# Patient Record
Sex: Female | Born: 1970 | Race: White | Hispanic: No | Marital: Single | Smoking: Never smoker
Health system: Southern US, Community
[De-identification: ages and names within clinical notes are randomized; demographics above are authoritative.]

## PROBLEM LIST (undated history)

## (undated) DIAGNOSIS — L309 Dermatitis, unspecified: Secondary | ICD-10-CM

## (undated) DIAGNOSIS — I872 Venous insufficiency (chronic) (peripheral): Secondary | ICD-10-CM

## (undated) DIAGNOSIS — D649 Anemia, unspecified: Secondary | ICD-10-CM

---

## 2018-11-02 ENCOUNTER — Emergency Department
Admission: EM | Admit: 2018-11-02 | Discharge: 2018-11-02 | Disposition: A | Discharge status: Home / Self Care | Payer: Self-pay | Attending: Emergency Medicine | Admitting: Emergency Medicine

## 2018-11-02 ENCOUNTER — Other Ambulatory Visit: Payer: Self-pay

## 2018-11-02 DIAGNOSIS — N760 Acute vaginitis: Secondary | ICD-10-CM | POA: Insufficient documentation

## 2018-11-02 DIAGNOSIS — R519 Headache, unspecified: Secondary | ICD-10-CM

## 2018-11-02 DIAGNOSIS — E876 Hypokalemia: Secondary | ICD-10-CM | POA: Insufficient documentation

## 2018-11-02 DIAGNOSIS — N898 Other specified noninflammatory disorders of vagina: Secondary | ICD-10-CM | POA: Insufficient documentation

## 2018-11-02 DIAGNOSIS — E860 Dehydration: Secondary | ICD-10-CM | POA: Insufficient documentation

## 2018-11-02 DIAGNOSIS — R42 Dizziness and giddiness: Secondary | ICD-10-CM

## 2018-11-02 DIAGNOSIS — B9689 Other specified bacterial agents as the cause of diseases classified elsewhere: Secondary | ICD-10-CM | POA: Insufficient documentation

## 2018-11-02 DIAGNOSIS — R110 Nausea: Secondary | ICD-10-CM | POA: Insufficient documentation

## 2018-11-02 DIAGNOSIS — R51 Headache: Secondary | ICD-10-CM | POA: Insufficient documentation

## 2018-11-02 HISTORY — DX: Venous insufficiency (chronic) (peripheral): I87.2

## 2018-11-02 HISTORY — DX: Dermatitis, unspecified: L30.9

## 2018-11-02 HISTORY — DX: Anemia, unspecified: D64.9

## 2018-11-02 LAB — CHLAMYDIA/NGC RT PCR (ARMC ONLY)
Chlamydia Tr: NOT DETECTED
N GONORRHOEAE: NOT DETECTED

## 2018-11-02 LAB — CBC
HCT: 35.8 % — ABNORMAL LOW (ref 36.0–46.0)
Hemoglobin: 11.6 g/dL — ABNORMAL LOW (ref 12.0–15.0)
MCH: 28.4 pg (ref 26.0–34.0)
MCHC: 32.4 g/dL (ref 30.0–36.0)
MCV: 87.5 fL (ref 80.0–100.0)
NRBC: 0 % (ref 0.0–0.2)
Platelets: 288 10*3/uL (ref 150–400)
RBC: 4.09 MIL/uL (ref 3.87–5.11)
RDW: 13.7 % (ref 11.5–15.5)
WBC: 9.7 10*3/uL (ref 4.0–10.5)

## 2018-11-02 LAB — URINALYSIS, COMPLETE (UACMP) WITH MICROSCOPIC
Bilirubin Urine: NEGATIVE
Glucose, UA: NEGATIVE mg/dL
Hgb urine dipstick: NEGATIVE
Ketones, ur: NEGATIVE mg/dL
NITRITE: NEGATIVE
Protein, ur: 30 mg/dL — AB
SPECIFIC GRAVITY, URINE: 1.025 (ref 1.005–1.030)
pH: 7 (ref 5.0–8.0)

## 2018-11-02 LAB — BASIC METABOLIC PANEL
Anion gap: 6 (ref 5–15)
BUN: 12 mg/dL (ref 6–20)
CHLORIDE: 105 mmol/L (ref 98–111)
CO2: 28 mmol/L (ref 22–32)
Calcium: 9.4 mg/dL (ref 8.9–10.3)
Creatinine, Ser: 0.54 mg/dL (ref 0.44–1.00)
GFR calc non Af Amer: >60 mL/min (ref >60)
Glucose, Bld: 99 mg/dL (ref 70–99)
POTASSIUM: 3.2 mmol/L — AB (ref 3.5–5.1)
Sodium: 139 mmol/L (ref 135–145)

## 2018-11-02 LAB — WET PREP, GENITAL
SPERM: NONE SEEN
Trich, Wet Prep: NONE SEEN
YEAST WET PREP: NONE SEEN

## 2018-11-02 LAB — PREGNANCY, URINE: Preg Test, Ur: NEGATIVE

## 2018-11-02 MED ORDER — BUTALBITAL-APAP-CAFFEINE 50-325-40 MG PO TABS: 1.0000 | ORAL_TABLET | Freq: Four times a day (QID) | ORAL | 0 refills | Status: AC | PRN

## 2018-11-02 MED ORDER — POTASSIUM CHLORIDE CRYS ER 20 MEQ PO TBCR
40.0000 meq | EXTENDED_RELEASE_TABLET | Freq: Once | ORAL | Status: AC
  Administered 2018-11-02: 40 meq via ORAL
  Filled 2018-11-02: qty 2

## 2018-11-02 MED ORDER — METRONIDAZOLE 500 MG PO TABS: 500.0000 mg | ORAL_TABLET | Freq: Two times a day (BID) | ORAL | 0 refills | Status: AC

## 2018-11-02 MED ORDER — SODIUM CHLORIDE 0.9 % IV BOLUS: 1000.0000 mL | Freq: Once | INTRAVENOUS | Status: DC

## 2018-11-02 MED ORDER — KETOROLAC TROMETHAMINE 30 MG/ML IJ SOLN
15.0000 mg | Freq: Once | INTRAMUSCULAR | Status: AC
  Administered 2018-11-02: 15 mg via INTRAVENOUS
  Filled 2018-11-02: qty 1

## 2018-11-02 MED ORDER — METOCLOPRAMIDE HCL 5 MG/ML IJ SOLN
10.0000 mg | Freq: Once | INTRAMUSCULAR | Status: AC
  Administered 2018-11-02: 10 mg via INTRAVENOUS
  Filled 2018-11-02: qty 2

## 2018-11-02 MED ORDER — METRONIDAZOLE 500 MG PO TABS
500.0000 mg | ORAL_TABLET | Freq: Once | ORAL | Status: AC
  Administered 2018-11-02: 500 mg via ORAL
  Filled 2018-11-02: qty 1

## 2018-11-02 NOTE — ED Triage Notes (Signed)
C/o dizziness upon awakening, body aches. Pt reports she recently moved here and does not have PCP established. Pt does report hx of anemia. Nose bleed X 2 days ago-unsure if related.  Pt alert and oriented X4, active, cooperative, pt in NAD. RR even and unlabored, color WNL.

## 2018-11-02 NOTE — ED Provider Notes (Signed)
[facility] Emergency Department Provider Note  ____________________________________________  Time seen: Approximately 6:08 PM  I have reviewed the triage vital signs and the nursing notes.   HISTORY  Chief Complaint Dizziness   HPI Cindy Myers is a 47 y.o. female with a history of anemia presents for evaluation of dizziness.  Patient [place] ago [place] and has not had a chance to establish care with the primary care doctor.  She has a history of iron deficiency anemia on iron supplementation.  She reports over the last few days she has had intermittent episodes of feeling lightheaded like she is going to pass out.  These episodes usually happen with standing up.  She had an episode 2 days ago associated with a nosebleed however that resolved.  The one today has been more persistent.  She denies any vertigo, imbalance, gait abnormalities, slurred speech, facial droop, unilateral weakness or numbness.  She describes her symptoms as feeling like she is going to pass out when she stands up.  She also has had more frequent headaches.  She does have a history of migraine headaches.  This headache feels like her chronic headaches.  Over the last week she has had a more often.  The headache is frontal, pressure, currently 6 out of 10 which is the highest intensity has been.  She has tried Tylenol and ibuprofen at home.  She denies fever or chills, abdominal pain.  She has had nausea but no vomiting.  She has had normal appetite.  She reports drinking very small amount of alcohol in a [place] last night [place] [employer].  She reports that she drinks very small amount of alcohol usually once or twice a year.  She denies thunderclap headache, fever or chills, URI symptoms, sore throat, chest pain or shortness of breath.  She is also complaining of 2 weeks of foul smelling vaginal discharge, no prior of STDs. No dysuria. Patient presents  to the emergency room today because she was told by her doctor that if she has worsening headaches and dizzy spells that her anemia could be worse and since she does not have a PCP to check her lab work she came to the emergency room for that.   Past Medical History:  Diagnosis Date  . Anemia   . Eczema   . Venous insufficiency     There are no active problems to display for this patient.   History reviewed. No pertinent surgical history.  Prior to Admission medications   Medication Sig Start Date End Date Taking? Authorizing Provider  butalbital-acetaminophen-caffeine (FIORICET, ESGIC) 50-325-40 MG tablet Take 1-2 tablets by mouth every 6 (six) hours as needed for headache. 11/02/18 11/02/19  Nita SickleVeronese, [place], MD  metroNIDAZOLE (FLAGYL) 500 MG tablet Take 1 tablet (500 mg total) by mouth 2 (two) times daily for 7 days. 11/02/18 11/09/18  Nita SickleVeronese, [place], MD    Allergies Patient has no known allergies.  No family history on file.  Social History Social History   Tobacco Use  . Smoking status: Never Smoker  Substance Use Topics  . Alcohol use: Not Currently  . Drug use: Not on file    Review of Systems  Constitutional: Negative for fever. + Lightheadedness Eyes: Negative for visual changes. ENT: Negative for sore throat. Neck: No neck pain  Cardiovascular: Negative for chest pain. Respiratory: Negative for shortness of breath. Gastrointestinal: Negative for abdominal pain, vomiting or diarrhea. Genitourinary: Negative for dysuria. Musculoskeletal: Negative for  back pain. Skin: Negative for rash. Neurological: Negative for weakness or numbness. + HA Psych: No SI or HI  ____________________________________________   PHYSICAL EXAM:  VITAL SIGNS: ED Triage Vitals  Enc Vitals Group     BP 11/02/18 1525 (!) 132/94     Pulse Rate 11/02/18 1525 85     Resp 11/02/18 1525 14     Temp 11/02/18 1525 98.3 F (36.8 C)     Temp Source 11/02/18 1525 Oral     SpO2  11/02/18 1525 100 %     Weight 11/02/18 1531 141 lb (64 kg)     Height 11/02/18 1531 5\' 2"  (1.575 m)     Head Circumference --      Peak Flow --      Pain Score 11/02/18 1529 7     Pain Loc --      Pain Edu? --      Excl. in GC? --     Constitutional: Alert and oriented. Well appearing and in no apparent distress. HEENT:      Head: Normocephalic and atraumatic.         Eyes: Conjunctivae are normal. Sclera is non-icteric.       Mouth/Throat: Mucous membranes are moist.       Neck: Supple with no signs of meningismus. Cardiovascular: Regular rate and rhythm. No murmurs, gallops, or rubs. 2+ symmetrical distal pulses are present in all extremities. No JVD. Respiratory: Normal respiratory effort. Lungs are clear to auscultation bilaterally. No wheezes, crackles, or rhonchi.  Gastrointestinal: Soft, non tender, and non distended with positive bowel sounds. No rebound or guarding. Pelvic exam: Normal external genitalia, no rashes or lesions. Normal cervical mucus. Os closed. No cervical motion tenderness.  No uterine or adnexal tenderness.   Musculoskeletal: Nontender with normal range of motion in all extremities. No edema, cyanosis, or erythema of extremities. Neurologic: Normal speech and language.  Pupils equal round and reactive bilaterally, extraocular movements intact, face symmetric, strength and sensation normal x4, no pronator drift, no dysmetria on finger-to-nose finger, normal gait. Skin: Skin is warm, dry and intact. No rash noted. Psychiatric: Mood and affect are normal. Speech and behavior are normal.  ____________________________________________   LABS (all labs ordered are listed, but only abnormal results are displayed)  Labs Reviewed  WET PREP, GENITAL - Abnormal; Notable for the following components:      Result Value   Clue Cells Wet Prep HPF POC PRESENT (*)    WBC, Wet Prep HPF POC MANY (*)    All other components within normal limits  BASIC METABOLIC PANEL -  Abnormal; Notable for the following components:   Potassium 3.2 (*)    All other components within normal limits  CBC - Abnormal; Notable for the following components:   Hemoglobin 11.6 (*)    HCT 35.8 (*)    All other components within normal limits  URINALYSIS, COMPLETE (UACMP) WITH MICROSCOPIC - Abnormal; Notable for the following components:   Color, Urine YELLOW (*)    APPearance HAZY (*)    Protein, ur 30 (*)    Leukocytes, UA SMALL (*)    Bacteria, UA RARE (*)    All other components within normal limits  CHLAMYDIA/NGC RT PCR ([facility] ONLY)  URINE CULTURE  PREGNANCY, URINE  CBG MONITORING, ED   ____________________________________________  EKG  ED ECG REPORT I, Nita Sickle, the attending physician, personally viewed and interpreted this ECG.  Normal sinus rhythm, rate of 86, normal intervals, normal axis, no ST  elevations or depressions.  Normal EKG. ____________________________________________  RADIOLOGY  I have personally reviewed the images performed during this visit and I agree with the Radiologist's read.   Interpretation by Radiologist:  No results found.    ____________________________________________   PROCEDURES  Procedure(s) performed: None Procedures Critical Care performed:  None ____________________________________________   INITIAL IMPRESSION / ASSESSMENT AND PLAN / ED COURSE  47 y.o. female with a history of anemia presents for evaluation of lightheadedness and headache.  Patient is extremely well-appearing, completely neurologically intact, no orthostasis, normal vital signs, exam is completely nonfocal.  Patient with history of chronic headaches here with an exacerbation of these headaches.  Possibly mild dehydration.  No thunderclap headache, no neuro deficits.  No indication for head CT at this time.  Will treat as a migraine headache with fluids, Reglan, Toradol.  Labs showing mild anemia with hemoglobin of 11.6, mild  hypokalemia, supplemented PO.   Vaginal discharge: Normal pelvic exam with small amount of watery white vaginal discharge.  Wet prep, GC and chlamydia sent for analysis.  No cervical motion tenderness, no adnexal tenderness.  Clinical Course as of Nov 02 2333  Sat Nov 02, 2018  1610 Patient feels better, headache has resolved, wet prep is positive for BV for which she was started on Flagyl.  GC and Chlamydia are pending.  Will discharge patient home with continue iron p.o., Fioricet for her chronic headaches, and Flagyl for vaginal discharge.  Will refer her to a local PCP for establishment of care.  I discussed standard return precautions.   [CV]    Clinical Course User Index [CV] Don Perking [place], MD     As part of my medical decision making, I reviewed the following data within the electronic MEDICAL RECORD NUMBER Nursing notes reviewed and incorporated, Labs reviewed , Notes from prior ED visits and [place] Controlled Substance Database    Pertinent labs & imaging results that were available during my care of the patient were reviewed by me and considered in my medical decision making (see chart for details).    ____________________________________________   FINAL CLINICAL IMPRESSION(S) / ED DIAGNOSES  Final diagnoses:  Dehydration  Hypokalemia  Acute nonintractable headache, unspecified headache type  BV (bacterial vaginosis)  Dizziness      NEW MEDICATIONS STARTED DURING THIS VISIT:  ED Discharge Orders         Ordered    metroNIDAZOLE (FLAGYL) 500 MG tablet  2 times daily     11/02/18 1915    butalbital-acetaminophen-caffeine (FIORICET, ESGIC) 50-325-40 MG tablet  Every 6 hours PRN     11/02/18 1915           Note:  This document was prepared using Dragon voice recognition software and may include unintentional dictation errors.    Nita Sickle, MD 11/02/18 [phone]

## 2018-11-02 NOTE — Discharge Instructions (Addendum)

## 2018-11-04 LAB — URINE CULTURE

## 2018-12-01 ENCOUNTER — Other Ambulatory Visit: Payer: Self-pay

## 2018-12-01 ENCOUNTER — Emergency Department
Admission: EM | Admit: 2018-12-01 | Discharge: 2018-12-01 | Disposition: A | Discharge status: Home / Self Care | Payer: Self-pay | Attending: Student in an Organized Health Care Education/Training Program | Admitting: Student in an Organized Health Care Education/Training Program

## 2018-12-01 ENCOUNTER — Emergency Department: Payer: Self-pay

## 2018-12-01 DIAGNOSIS — N898 Other specified noninflammatory disorders of vagina: Secondary | ICD-10-CM | POA: Insufficient documentation

## 2018-12-01 DIAGNOSIS — R102 Pelvic and perineal pain: Secondary | ICD-10-CM | POA: Insufficient documentation

## 2018-12-01 LAB — WET PREP, GENITAL
CLUE CELLS WET PREP: NONE SEEN
SPERM: NONE SEEN
TRICH WET PREP: NONE SEEN
Yeast Wet Prep HPF POC: NONE SEEN

## 2018-12-01 LAB — CBC WITH DIFFERENTIAL/PLATELET
Abs Immature Granulocytes: 0.03 10*3/uL (ref 0.00–0.07)
BASOS ABS: 0.1 10*3/uL (ref 0.0–0.1)
BASOS PCT: 1 %
EOS PCT: 6 %
Eosinophils Absolute: 0.5 10*3/uL (ref 0.0–0.5)
HCT: 36 % (ref 36.0–46.0)
HEMOGLOBIN: 11.4 g/dL — AB (ref 12.0–15.0)
Immature Granulocytes: 0 %
LYMPHS PCT: 13 %
Lymphs Abs: 1.1 10*3/uL (ref 0.7–4.0)
MCH: 29.2 pg (ref 26.0–34.0)
MCHC: 31.7 g/dL (ref 30.0–36.0)
MCV: 92.3 fL (ref 80.0–100.0)
Monocytes Absolute: 0.7 10*3/uL (ref 0.1–1.0)
Monocytes Relative: 8 %
NRBC: 0 % (ref 0.0–0.2)
Neutro Abs: 6.2 10*3/uL (ref 1.7–7.7)
Neutrophils Relative %: 72 %
PLATELETS: 338 10*3/uL (ref 150–400)
RBC: 3.9 MIL/uL (ref 3.87–5.11)
RDW: 13.9 % (ref 11.5–15.5)
WBC: 8.6 10*3/uL (ref 4.0–10.5)

## 2018-12-01 LAB — URINALYSIS, ROUTINE W REFLEX MICROSCOPIC
Bilirubin Urine: NEGATIVE
Glucose, UA: NEGATIVE mg/dL
Hgb urine dipstick: NEGATIVE
KETONES UR: NEGATIVE mg/dL
LEUKOCYTES UA: NEGATIVE
NITRITE: NEGATIVE
PH: 5 (ref 5.0–8.0)
Protein, ur: NEGATIVE mg/dL
Specific Gravity, Urine: 1.029 (ref 1.005–1.030)

## 2018-12-01 LAB — COMPREHENSIVE METABOLIC PANEL
ALK PHOS: 47 U/L (ref 38–126)
ALT: 10 U/L (ref 0–44)
ANION GAP: 6 (ref 5–15)
AST: 12 U/L — ABNORMAL LOW (ref 15–41)
Albumin: 4.3 g/dL (ref 3.5–5.0)
BUN: 17 mg/dL (ref 6–20)
CHLORIDE: 103 mmol/L (ref 98–111)
CO2: 27 mmol/L (ref 22–32)
Calcium: 9.2 mg/dL (ref 8.9–10.3)
Creatinine, Ser: 0.51 mg/dL (ref 0.44–1.00)
GFR calc Af Amer: >60 mL/min (ref >60)
GFR calc non Af Amer: >60 mL/min (ref >60)
GLUCOSE: 103 mg/dL — AB (ref 70–99)
Potassium: 4.4 mmol/L (ref 3.5–5.1)
SODIUM: 136 mmol/L (ref 135–145)
Total Bilirubin: 0.3 mg/dL (ref 0.3–1.2)
Total Protein: 7.3 g/dL (ref 6.5–8.1)

## 2018-12-01 LAB — CHLAMYDIA/NGC RT PCR (ARMC ONLY)
CHLAMYDIA TR: NOT DETECTED
N gonorrhoeae: NOT DETECTED

## 2018-12-01 LAB — PREGNANCY, URINE: Preg Test, Ur: NEGATIVE

## 2018-12-01 MED ORDER — DOXYCYCLINE HYCLATE 50 MG PO CAPS: 100.0000 mg | ORAL_CAPSULE | Freq: Two times a day (BID) | ORAL | 0 refills | Status: DC

## 2018-12-01 MED ORDER — AZITHROMYCIN 500 MG PO TABS
1000.0000 mg | ORAL_TABLET | Freq: Once | ORAL | Status: AC
  Administered 2018-12-01: 1000 mg via ORAL
  Filled 2018-12-01: qty 2

## 2018-12-01 MED ORDER — DOXYCYCLINE HYCLATE 100 MG PO TABS: 100.0000 mg | ORAL_TABLET | Freq: Two times a day (BID) | ORAL | 0 refills | Status: AC

## 2018-12-01 MED ORDER — HYDROCODONE-ACETAMINOPHEN 5-325 MG PO TABS: 1.0000 | ORAL_TABLET | ORAL | 0 refills | Status: AC | PRN

## 2018-12-01 MED ORDER — CEFTRIAXONE SODIUM 250 MG IJ SOLR
250.0000 mg | Freq: Once | INTRAMUSCULAR | Status: AC
  Administered 2018-12-01: 250 mg via INTRAMUSCULAR
  Filled 2018-12-01: qty 250

## 2018-12-01 NOTE — ED Triage Notes (Signed)
Reports, dysuria, hematuria and urgency for "couple" days.

## 2018-12-01 NOTE — ED Notes (Signed)
Pt reports pelvic pain with difficulty voiding; says she has noticed scant blood on tissue when wiping after voiding;

## 2018-12-01 NOTE — ED Provider Notes (Signed)
[facility] Emergency Department Provider Note    First MD Initiated Contact with Patient 12/01/18 0254     (approximate)  I have reviewed the triage vital signs and the nursing notes.   HISTORY  Chief Complaint Dysuria    HPI Cindy Myers is a 47 y.o. female presents to the ER for several day evaluation of dysuria vaginal discharge and pelvic discomfort.  Was recently given abx for bv.  Denies any history of PID.  She is sexually active with one partner.  Does not know she has a history of a kidney stones or cysts.  Is denies any fevers.  No nausea or vomiting.  No diarrhea.    Past Medical History:  Diagnosis Date  . Anemia   . Eczema   . Venous insufficiency    No family history on file. No past surgical history on file. There are no active problems to display for this patient.     Prior to Admission medications   Medication Sig Start Date End Date Taking? Authorizing Provider  butalbital-acetaminophen-caffeine (FIORICET, ESGIC) 50-325-40 MG tablet Take 1-2 tablets by mouth every 6 (six) hours as needed for headache. 11/02/18 11/02/19  Nita SickleVeronese, [place], MD  doxycycline (VIBRA-TABS) 100 MG tablet Take 1 tablet (100 mg total) by mouth 2 (two) times daily for 7 days. 12/01/18 12/08/18  Willy Eddyobinson, Shiza [place], MD  HYDROcodone-acetaminophen (NORCO) 5-325 MG tablet Take 1 tablet by mouth every 4 (four) hours as needed for moderate pain. 12/01/18   Willy Eddyobinson, Bradyn Soward, MD    Allergies Patient has no known allergies.    Social History Social History   Tobacco Use  . Smoking status: Never Smoker  Substance Use Topics  . Alcohol use: Not Currently  . Drug use: Not on file    Review of Systems Patient denies headaches, rhinorrhea, blurry vision, numbness, shortness of breath, chest pain, edema, cough, abdominal pain, nausea, vomiting, diarrhea, dysuria, fevers, rashes or hallucinations unless otherwise stated above in  HPI. ____________________________________________   PHYSICAL EXAM:  VITAL SIGNS: Vitals:   12/01/18 0050  BP: 112/73  Pulse: 96  Resp: 18  Temp: 98.1 F (36.7 C)  SpO2: 99%    Constitutional: Alert and oriented.  Eyes: Conjunctivae are normal.  Head: Atraumatic. Nose: No congestion/rhinnorhea. Mouth/Throat: Mucous membranes are moist.   Neck: No stridor. Painless ROM.  Cardiovascular: Normal rate, regular rhythm. Grossly normal heart sounds.  Good peripheral circulation. Respiratory: Normal respiratory effort.  No retractions. Lungs CTAB. Gastrointestinal: Soft and nontender. No distention. No abdominal bruits. No CVA tenderness. Genitourinary: No adnexal tenderness but there is cervical motion tenderness to palpation with purulent discharge. Musculoskeletal: No lower extremity tenderness nor edema.  No joint effusions. Neurologic:  Normal speech and language. No gross focal neurologic deficits are appreciated. No facial droop Skin:  Skin is warm, dry and intact. No rash noted. Psychiatric: Mood and affect are normal. Speech and behavior are normal.  ____________________________________________   LABS (all labs ordered are listed, but only abnormal results are displayed)  Results for orders placed or performed during the hospital encounter of 12/01/18 (from the past 24 hour(s))  Urinalysis, Routine w reflex microscopic     Status: Abnormal   Collection Time: 12/01/18 12:53 AM  Result Value Ref Range   Color, Urine YELLOW (A) YELLOW   APPearance CLEAR (A) CLEAR   Specific Gravity, Urine 1.029 1.005 - 1.030   pH 5.0 5.0 - 8.0   Glucose, UA NEGATIVE NEGATIVE mg/dL   Hgb urine dipstick  NEGATIVE NEGATIVE   Bilirubin Urine NEGATIVE NEGATIVE   Ketones, ur NEGATIVE NEGATIVE mg/dL   Protein, ur NEGATIVE NEGATIVE mg/dL   Nitrite NEGATIVE NEGATIVE   Leukocytes, UA NEGATIVE NEGATIVE  Pregnancy, urine     Status: None   Collection Time: 12/01/18 12:53 AM  Result Value Ref  Range   Preg Test, Ur NEGATIVE NEGATIVE  CBC with Differential/Platelet     Status: Abnormal   Collection Time: 12/01/18  3:41 AM  Result Value Ref Range   WBC 8.6 4.0 - 10.5 K/uL   RBC 3.90 3.87 - 5.11 MIL/uL   Hemoglobin 11.4 (L) 12.0 - 15.0 g/dL   HCT 29.5 28.4 - 13.2 %   MCV 92.3 80.0 - 100.0 fL   MCH 29.2 26.0 - 34.0 pg   MCHC 31.7 30.0 - 36.0 g/dL   RDW 44.0 10.2 - 72.5 %   Platelets 338 150 - 400 K/uL   nRBC 0.0 0.0 - 0.2 %   Neutrophils Relative % 72 %   Neutro Abs 6.2 1.7 - 7.7 K/uL   Lymphocytes Relative 13 %   Lymphs Abs 1.1 0.7 - 4.0 K/uL   Monocytes Relative 8 %   Monocytes Absolute 0.7 0.1 - 1.0 K/uL   Eosinophils Relative 6 %   Eosinophils Absolute 0.5 0.0 - 0.5 K/uL   Basophils Relative 1 %   Basophils Absolute 0.1 0.0 - 0.1 K/uL   Immature Granulocytes 0 %   Abs Immature Granulocytes 0.03 0.00 - 0.07 K/uL  Comprehensive metabolic panel     Status: Abnormal   Collection Time: 12/01/18  3:41 AM  Result Value Ref Range   Sodium 136 135 - 145 mmol/L   Potassium 4.4 3.5 - 5.1 mmol/L   Chloride 103 98 - 111 mmol/L   CO2 27 22 - 32 mmol/L   Glucose, Bld 103 (H) 70 - 99 mg/dL   BUN 17 6 - 20 mg/dL   Creatinine, Ser 3.66 0.44 - 1.00 mg/dL   Calcium 9.2 8.9 - 44.0 mg/dL   Total Protein 7.3 6.5 - 8.1 g/dL   Albumin 4.3 3.5 - 5.0 g/dL   AST 12 (L) 15 - 41 U/L   ALT 10 0 - 44 U/L   Alkaline Phosphatase 47 38 - 126 U/L   Total Bilirubin 0.3 0.3 - 1.2 mg/dL   GFR calc non Af Amer >60 >60 mL/min   GFR calc Af Amer >60 >60 mL/min   Anion gap 6 5 - 15  Wet prep, genital     Status: Abnormal   Collection Time: 12/01/18  5:11 AM  Result Value Ref Range   Yeast Wet Prep HPF POC NONE SEEN NONE SEEN   Trich, Wet Prep NONE SEEN NONE SEEN   Clue Cells Wet Prep HPF POC NONE SEEN NONE SEEN   WBC, Wet Prep HPF POC RARE (A) NONE SEEN   Sperm NONE SEEN   Chlamydia/NGC rt PCR ([facility] only)     Status: None   Collection Time: 12/01/18  5:11 AM  Result Value Ref Range    Specimen source GC/Chlam ENDOCERVICAL    Chlamydia Tr NOT DETECTED NOT DETECTED   N gonorrhoeae NOT DETECTED NOT DETECTED   ____________________________________________ ____________________________________________  RADIOLOGY  I personally reviewed all radiographic images ordered to evaluate for the above acute complaints and reviewed radiology reports and findings.  These findings were personally discussed with the patient.  Please see medical record for radiology report.  ____________________________________________   PROCEDURES  Procedure(s) performed:  Procedures    Critical Care performed: no ____________________________________________   INITIAL IMPRESSION / ASSESSMENT AND PLAN / ED COURSE  Pertinent labs & imaging results that were available during my care of the patient were reviewed by me and considered in my medical decision making (see chart for details).   DDX: PID, tubo-ovarian abscess, UTI, cystitis  Cindy Myers is a 47 y.o. who presents to the ED with symptoms as described above.  Patient afebrile and nontoxic-appearing.  Given her symptoms will order ultrasound.  Will perform pelvic exam.  No evidence of UTI.  Abdominal exam is otherwise soft and benign.  Not consistent with appendicitis or intra-abdominal pathologies symptoms seem to be primarily pelvic in nature.  She is not pregnant.  Clinical Course as of Dec 01 718  Wynelle LinkSun Dec 01, 2018  0518 Patient does have purulent discharge on exam with some cervical motion tenderness.  No evidence of abscess on ultrasound.  Based on her symptomatology will give a course of antibiotics.   [PR]    Clinical Course User Index [PR] Willy Eddyobinson, Kashena Novitski, MD     As part of my medical decision making, I reviewed the following data within the electronic MEDICAL RECORD NUMBER Nursing notes reviewed and incorporated, Labs reviewed, notes from prior ED visits.   ____________________________________________   FINAL CLINICAL  IMPRESSION(S) / ED DIAGNOSES  Final diagnoses:  Pelvic pain      NEW MEDICATIONS STARTED DURING THIS VISIT:  Discharge Medication List as of 12/01/2018  6:00 AM    START taking these medications   Details  HYDROcodone-acetaminophen (NORCO) 5-325 MG tablet Take 1 tablet by mouth every 4 (four) hours as needed for moderate pain., Starting Sun 12/01/2018, Print    doxycycline (VIBRAMYCIN) 50 MG capsule Take 2 capsules (100 mg total) by mouth 2 (two) times daily for 7 days., Starting Sun 12/01/2018, Until Sun 12/08/2018, Print         Note:  This document was prepared using Dragon voice recognition software and may include unintentional dictation errors.    Willy Eddyobinson, Analisa Sledd, MD 12/01/18 [phone]

## 2018-12-01 NOTE — Discharge Instructions (Signed)

## 2018-12-01 NOTE — ED Notes (Signed)
Patient transported to Ultrasound 

## 2019-03-04 IMAGING — US US PELVIS COMPLETE TRANSABD/TRANSVAG
1 series · 14 of 25 positions shown · non-contrast
Comparison: None

CLINICAL DATA: Chronic pelvic pain.

EXAM:
TRANSABDOMINAL AND TRANSVAGINAL ULTRASOUND OF PELVIS
TECHNIQUE: Both transabdominal and transvaginal ultrasound examinations of the
pelvis were performed. Transabdominal technique was performed for
global imaging of the pelvis including uterus, ovaries, adnexal
regions, and pelvic cul-de-sac. It was necessary to proceed with
endovaginal exam following the transabdominal exam to visualize the
adnexa in greater detail.

[Series 1: us pelvis complete transabd/transvag · 0.24mm/px · 14 of 104 slices shown]
[im 1/104]
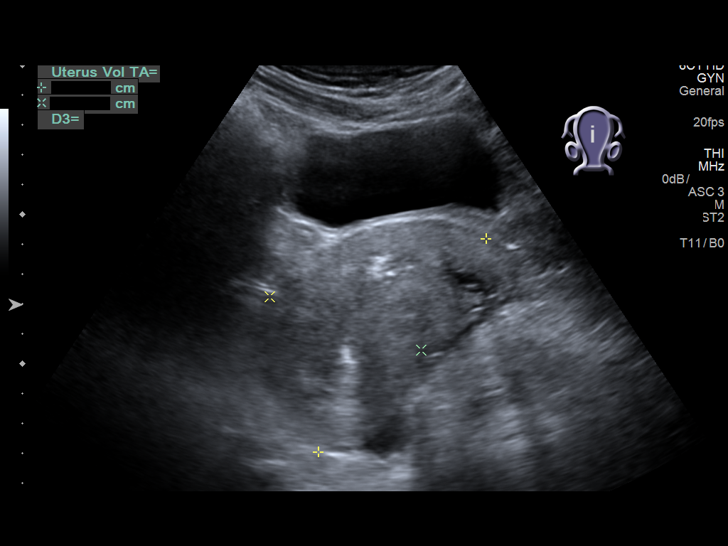
[im 9/104]
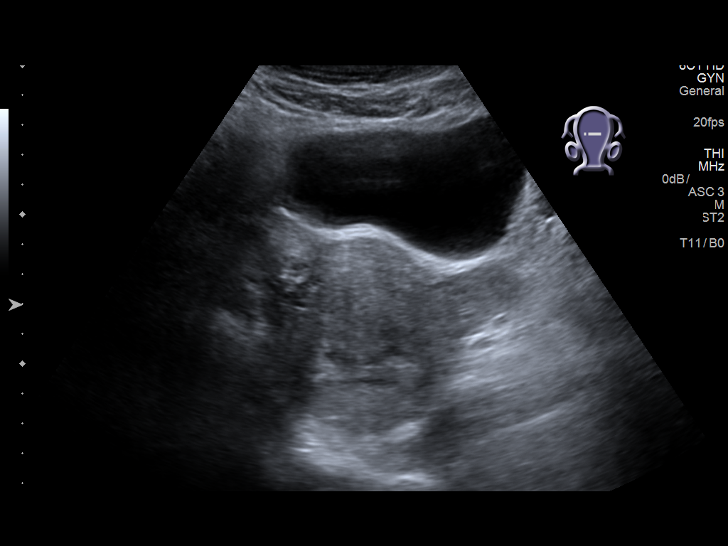
[im 18/104]
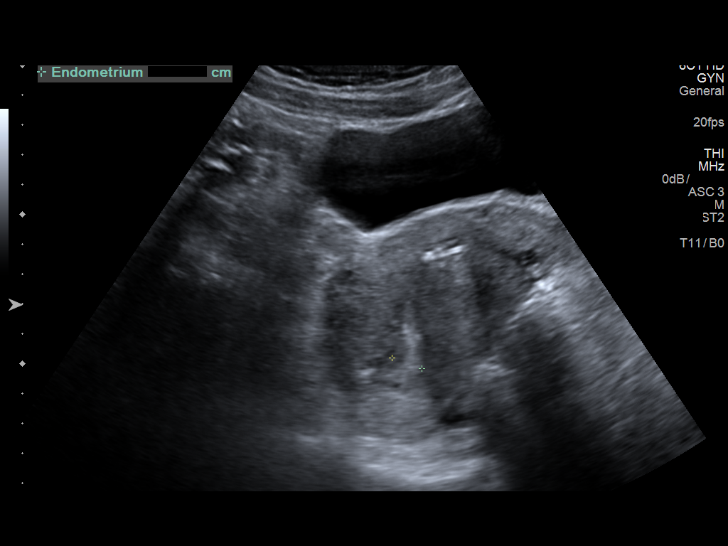
[im 26/104]
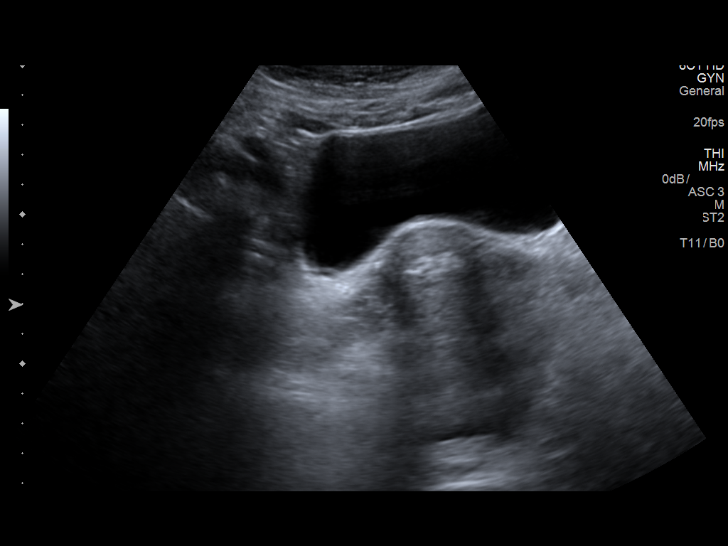
[im 35/104]
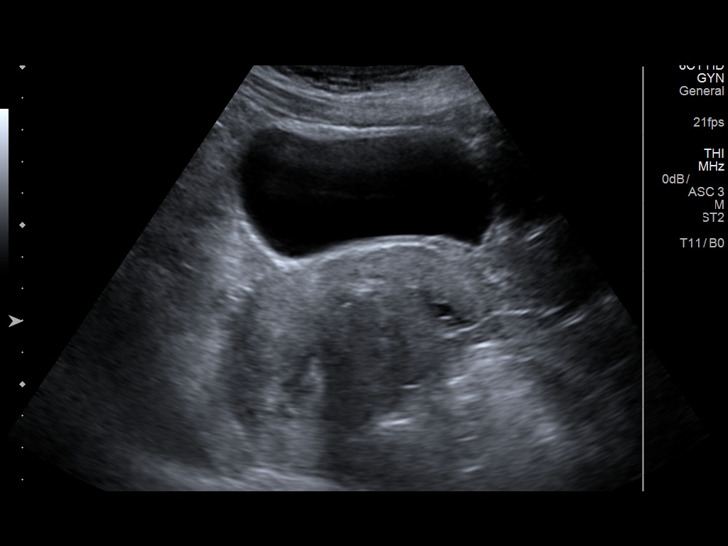
[im 39/104]
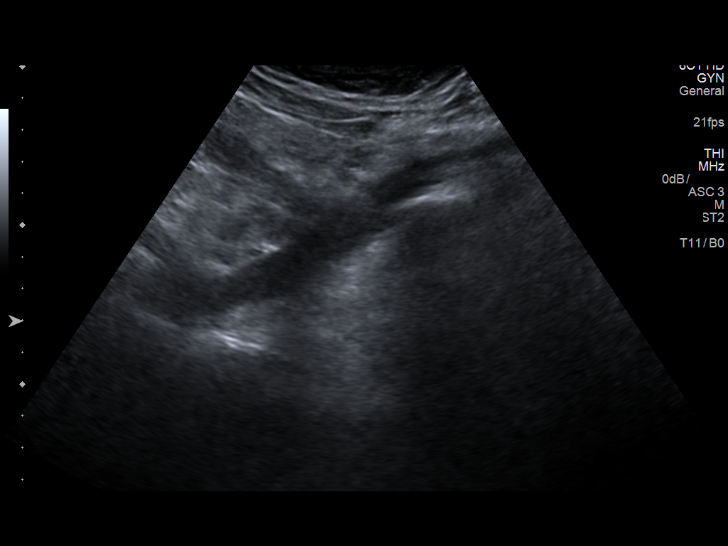
[im 48/104]
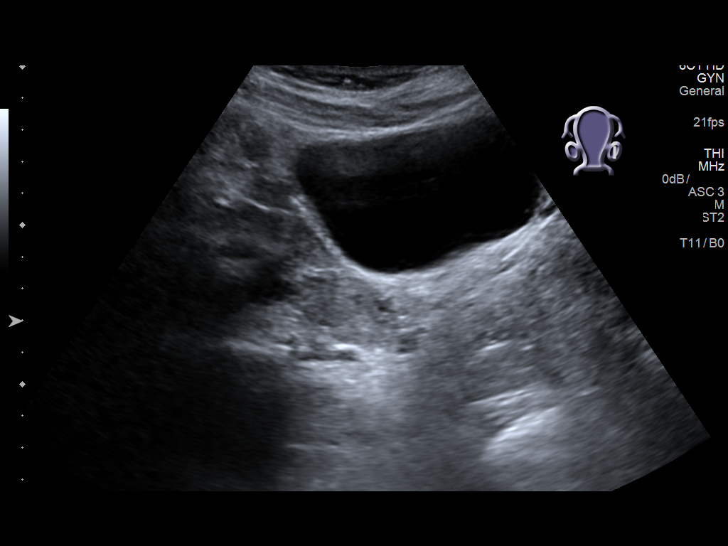
[im 56/104]
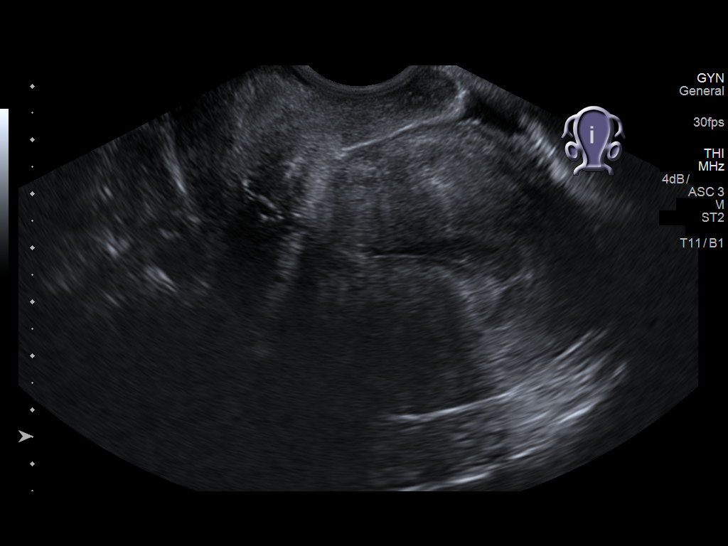
[im 65/104]
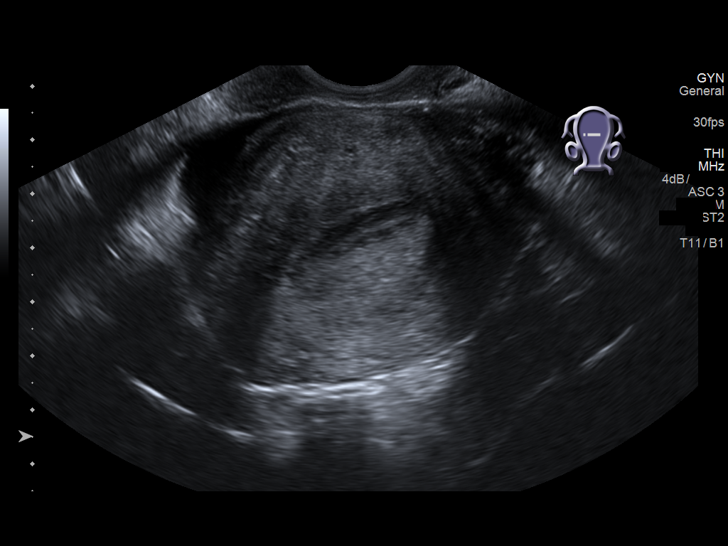
[im 69/104]
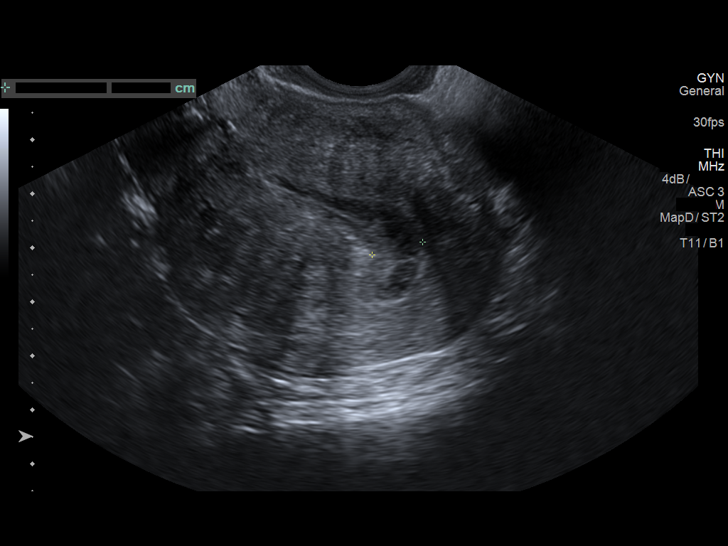
[im 78/104]
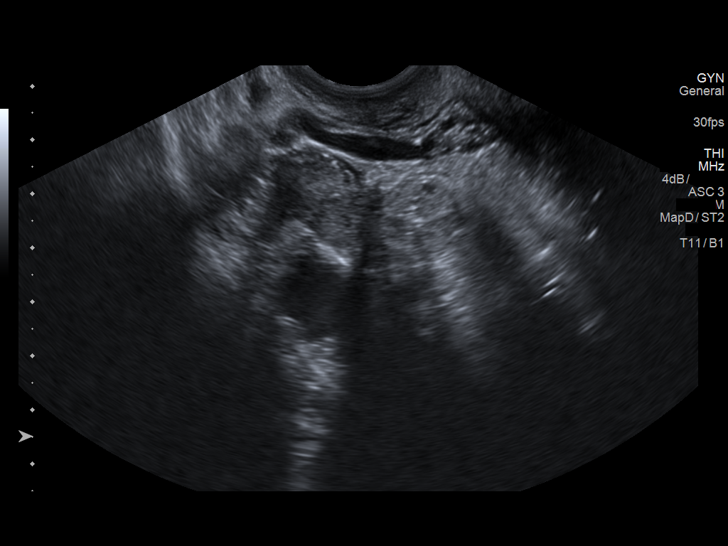
[im 86/104]
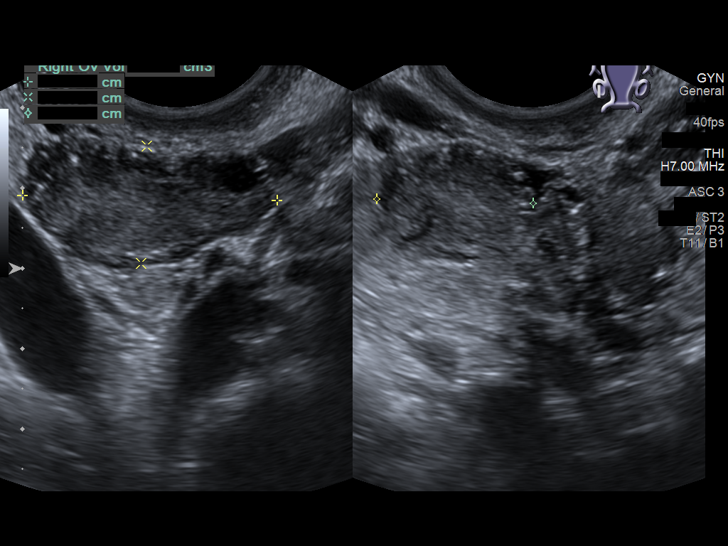
[im 95/104]
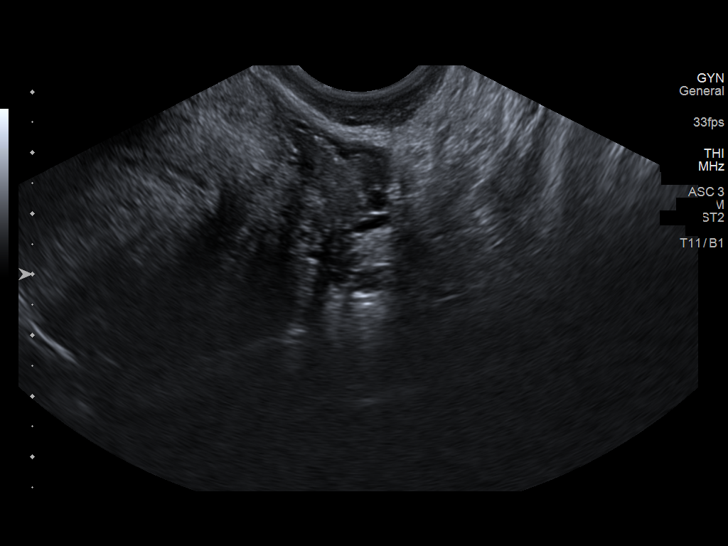
[im 104/104]
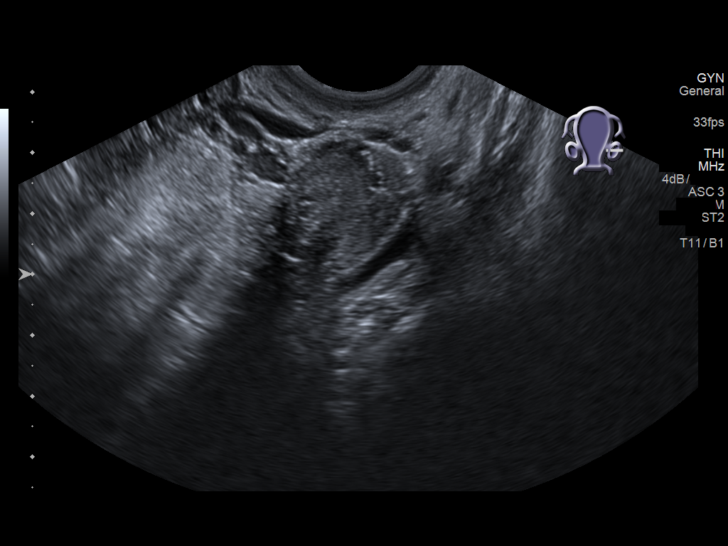

[14 of 25 positions shown; findings below may reference images not displayed]

FINDINGS: Uterus

Measurements: 9.3 x 5.7 x 5.6 cm = volume: 155 mL. No fibroids or
other mass visualized. The uterus is retroverted in nature.

Endometrium

Thickness: 1.0 cm.  No focal abnormality visualized.

Right ovary

Measurements: 3.2 x 1.5 x 2.0 cm = volume: 4.7 mL. Normal
appearance/no adnexal mass.

Left ovary

Measurements: 3.5 x 1.9 x 2.1 cm = volume: 7.3 mL. Normal
appearance/no adnexal mass.

Other findings

Trace free fluid is noted at the pelvic cul-de-sac.
IMPRESSION: Unremarkable pelvic ultrasound.  No evidence for ovarian torsion.
